# Patient Record
Sex: Male | Born: 1945 | Race: Black or African American | Hispanic: No | Marital: Single | Smoking: Former smoker
Health system: Southern US, Community
[De-identification: ages and names within clinical notes are randomized; demographics above are authoritative.]

---

## 2005-05-13 ENCOUNTER — Emergency Department (HOSPITAL_COMMUNITY): Admission: EM | Admit: 2005-05-13 | Discharge: 2005-05-13 | Payer: Self-pay | Admitting: Emergency Medicine

## 2010-10-18 ENCOUNTER — Emergency Department (HOSPITAL_BASED_OUTPATIENT_CLINIC_OR_DEPARTMENT_OTHER)
Admission: EM | Admit: 2010-10-18 | Discharge: 2010-10-18 | Payer: Self-pay | Source: Home / Self Care | Admitting: Emergency Medicine

## 2010-10-24 ENCOUNTER — Inpatient Hospital Stay (HOSPITAL_COMMUNITY)
Admission: EM | Admit: 2010-10-24 | Discharge: 2010-10-29 | Disposition: A | Discharge status: Rehabilitation Facility | Payer: Self-pay | Source: Home / Self Care | Attending: Internal Medicine | Admitting: Internal Medicine

## 2010-10-26 ENCOUNTER — Other Ambulatory Visit: Payer: Self-pay | Admitting: Internal Medicine

## 2010-10-27 LAB — GLUCOSE, CAPILLARY
Glucose-Capillary: 90 mg/dL (ref 70–99)
Glucose-Capillary: 94 mg/dL (ref 70–99)
Glucose-Capillary: 107 mg/dL — ABNORMAL HIGH (ref 70–99)

## 2010-10-28 LAB — GLUCOSE, CAPILLARY
Glucose-Capillary: 103 mg/dL — ABNORMAL HIGH (ref 70–99)
Glucose-Capillary: 97 mg/dL (ref 70–99)
Glucose-Capillary: 94 mg/dL (ref 70–99)
Glucose-Capillary: 97 mg/dL (ref 70–99)

## 2010-10-29 ENCOUNTER — Inpatient Hospital Stay (HOSPITAL_COMMUNITY)
Admission: RE | Admit: 2010-10-29 | Discharge: 2010-11-06 | Payer: Self-pay | Attending: Physical Medicine & Rehabilitation | Admitting: Physical Medicine & Rehabilitation

## 2010-10-29 ENCOUNTER — Encounter (INDEPENDENT_AMBULATORY_CARE_PROVIDER_SITE_OTHER): Payer: Self-pay | Admitting: Internal Medicine

## 2010-10-29 LAB — GLUCOSE, CAPILLARY
Glucose-Capillary: 103 mg/dL — ABNORMAL HIGH (ref 70–99)
Glucose-Capillary: 122 mg/dL — ABNORMAL HIGH (ref 70–99)
Glucose-Capillary: 81 mg/dL (ref 70–99)

## 2010-11-08 LAB — URINALYSIS, ROUTINE W REFLEX MICROSCOPIC
Bilirubin Urine: NEGATIVE
Hgb urine dipstick: NEGATIVE
Ketones, ur: NEGATIVE mg/dL
Nitrite: POSITIVE — AB
Protein, ur: NEGATIVE mg/dL
Specific Gravity, Urine: 1.017 (ref 1.005–1.030)
Urine Glucose, Fasting: NEGATIVE mg/dL
Urobilinogen, UA: 1 mg/dL (ref 0.0–1.0)
pH: 8 (ref 5.0–8.0)

## 2010-11-08 LAB — CBC
HCT: 42 % (ref 39.0–52.0)
HCT: 41.1 % (ref 39.0–52.0)
HCT: 42.7 % (ref 39.0–52.0)
Hemoglobin: 14.4 g/dL (ref 13.0–17.0)
Hemoglobin: 13.7 g/dL (ref 13.0–17.0)
Hemoglobin: 14.8 g/dL (ref 13.0–17.0)
MCH: 28.7 pg (ref 26.0–34.0)
MCH: 28 pg (ref 26.0–34.0)
MCH: 29.1 pg (ref 26.0–34.0)
MCHC: 34.3 g/dL (ref 30.0–36.0)
MCHC: 33.3 g/dL (ref 30.0–36.0)
MCHC: 34.7 g/dL (ref 30.0–36.0)
MCV: 83.8 fL (ref 78.0–100.0)
MCV: 84 fL (ref 78.0–100.0)
MCV: 83.9 fL (ref 78.0–100.0)
Platelets: 108 10*3/uL — ABNORMAL LOW (ref 150–400)
Platelets: 102 10*3/uL — ABNORMAL LOW (ref 150–400)
Platelets: 90 10*3/uL — ABNORMAL LOW (ref 150–400)
RBC: 5.01 MIL/uL (ref 4.22–5.81)
RBC: 4.89 MIL/uL (ref 4.22–5.81)
RBC: 5.09 MIL/uL (ref 4.22–5.81)
RDW: 13.5 % (ref 11.5–15.5)
RDW: 13.7 % (ref 11.5–15.5)
RDW: 13.4 % (ref 11.5–15.5)
WBC: 4.9 10*3/uL (ref 4.0–10.5)
WBC: 5.9 10*3/uL (ref 4.0–10.5)
WBC: 11.4 10*3/uL — ABNORMAL HIGH (ref 4.0–10.5)

## 2010-11-08 LAB — URINE CULTURE
Colony Count: >=100000
Culture  Setup Time: 201201111550

## 2010-11-08 LAB — DIFFERENTIAL
Basophils Absolute: 0 10*3/uL (ref 0.0–0.1)
Basophils Absolute: 0 10*3/uL (ref 0.0–0.1)
Basophils Relative: 0 % (ref 0–1)
Basophils Relative: 0 % (ref 0–1)
Eosinophils Absolute: 0.1 10*3/uL (ref 0.0–0.7)
Eosinophils Absolute: 0 10*3/uL (ref 0.0–0.7)
Eosinophils Relative: 2 % (ref 0–5)
Eosinophils Relative: 0 % (ref 0–5)
Lymphocytes Relative: 32 % (ref 12–46)
Lymphocytes Relative: 6 % — ABNORMAL LOW (ref 12–46)
Lymphs Abs: 1.9 10*3/uL (ref 0.7–4.0)
Lymphs Abs: 0.6 10*3/uL — ABNORMAL LOW (ref 0.7–4.0)
Monocytes Absolute: 0.4 10*3/uL (ref 0.1–1.0)
Monocytes Absolute: 0.8 10*3/uL (ref 0.1–1.0)
Monocytes Relative: 6 % (ref 3–12)
Monocytes Relative: 7 % (ref 3–12)
Neutro Abs: 3.5 10*3/uL (ref 1.7–7.7)
Neutro Abs: 10 10*3/uL — ABNORMAL HIGH (ref 1.7–7.7)
Neutrophils Relative %: 60 % (ref 43–77)
Neutrophils Relative %: 87 % — ABNORMAL HIGH (ref 43–77)

## 2010-11-08 LAB — COMPREHENSIVE METABOLIC PANEL
ALT: 9 U/L (ref 0–53)
AST: 13 U/L (ref 0–37)
Albumin: 3.4 g/dL — ABNORMAL LOW (ref 3.5–5.2)
Alkaline Phosphatase: 60 U/L (ref 39–117)
BUN: 11 mg/dL (ref 6–23)
CO2: 27 mEq/L (ref 19–32)
Calcium: 8.9 mg/dL (ref 8.4–10.5)
Chloride: 107 mEq/L (ref 96–112)
Creatinine, Ser: 0.97 mg/dL (ref 0.4–1.5)
GFR calc Af Amer: >60 mL/min (ref >60)
GFR calc non Af Amer: >60 mL/min (ref >60)
Glucose, Bld: 94 mg/dL (ref 70–99)
Potassium: 4.3 mEq/L (ref 3.5–5.1)
Sodium: 137 mEq/L (ref 135–145)
Total Bilirubin: 0.5 mg/dL (ref 0.3–1.2)
Total Protein: 6.6 g/dL (ref 6.0–8.3)

## 2010-11-08 LAB — URINE MICROSCOPIC-ADD ON

## 2010-11-10 LAB — CULTURE, BLOOD (ROUTINE X 2)
Culture  Setup Time: 201201111103
Culture  Setup Time: 201201111103
Culture: NO GROWTH
Culture: NO GROWTH
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
## 2010-11-12 NOTE — Discharge Summary (Signed)
NAME:  Drew Reynolds, Drew Reynolds NO.:  [id number]  MEDICAL RECORD NO.:  [id number]           PATIENT TYPE:  LOCATION:                                 FACILITY:  PHYSICIAN:  Pincus Large, MD     DATE OF BIRTH:  1946/01/11  DATE OF ADMISSION:  10/24/2010 DATE OF DISCHARGE:  10/29/2010                              DISCHARGE SUMMARY   PRIMARY CARE PHYSICIAN:  Unassigned.  Time spent on discharge summary was 35 minutes.  REASON FOR ADMISSION:  Unable to walk.  DISCHARGE DIAGNOSES: 1. Acute/subacute moderate size infarct, inferior left cerebellum and     the left aspect of the inferior vermis. 2. Remote infarct in the right cerebellum with encephalomalacia. 3. Cervicomedullary junction, unremarkable. 4. Global brain atrophy. 5. Hypertension. 6. Tobacco abuse.  DISCHARGE MEDICATIONS: 1. Aspirin 325 mg daily. 2. Tylenol 650 mg 6 p.r.n. 3. Metoprolol 12.5 p.o. b.i.d. 4. Norvasc 10 mg daily. 5. Simvastatin 10 daily.  RADIOLOGY DATA:  CT of the head was done, which shows abnormal low- attenuation in the inferior cerebellum hemisphere bilaterally left greater than right.  Imaging feature are compatible with acute to subacute ischemia.  There is edema in the left inferior cerebellar hemisphere, it does generate some mass effect on the fourth ventricle. Abdominal x-ray was done, which shows single polyp segment overlying the region of the left lateral sacrum.  MRI of the brain shows acute/subacute puncture site infarct in the inferior left cerebellum and the left aspect of the anterior vermis.  A remote infarct and small vessel disease type change are noted on global atrophy.  MRA of the neck was done, which had significant intracranial atherosclerotic type changes.  HOSPITAL COURSE:  A 65 year old gentleman who reported for the last 2 weeks, he had difficulty with gait.  He also had some dizziness as well, his balance has been so poor, his sister finally convinced  him to come to the doctor and on initial evaluation, the CT of the head shows bilateral cerebellar infarct.  The patient was admitted to the stroke floor.  A consult done with Neurology and recommendation was to do an MRI and MRA of the brain, echocardiogram, and start aspirin for now. The patient also was advised to stop smoking.  PT and OT consult was done and the patient qualify for a CIR.  The patient is going to be transferred to CIR today to continue with his blood pressure medication. Blood pressure medication was started; metoprolol 12.5 p.o. and then Norvasc was added because we had difficulty controlling his blood pressure.  Also, smoking cessation was done at the bedside.  PHYSICAL EXAMINATION:  VITAL SIGNS:  On discharge, temperature is 98.5, pulse is 76, respirations 18, and blood pressure is 150/99. GENERAL:  He is awake and oriented x3.  Lying in bed. HEENT:  Normocephalic, atraumatic.  Pupils are equal and reactive to light bilaterally. LUNGS:  No wheezes or crackles. ABDOMEN:  Soft.  Positive bowel sounds present. EXTREMITIES:  No cyanosis.  No edema.  DISPOSITION:  CIR.  DIET:  2-g sodium.          ______________________________ Granville Lewis  Boris Sharper, MD     SA/MEDQ  D:  10/29/2010  T:  10/29/2010  Job:  [id number]  Electronically Signed by Pincus Large MD on 11/12/2010 11:48:28 AM

## 2010-12-17 ENCOUNTER — Encounter: Payer: Medicaid Other | Attending: Physical Medicine & Rehabilitation

## 2010-12-17 ENCOUNTER — Inpatient Hospital Stay: Payer: Self-pay | Admitting: Physical Medicine & Rehabilitation

## 2011-01-03 LAB — CBC
HCT: 46.2 % (ref 39.0–52.0)
Hemoglobin: 16.5 g/dL (ref 13.0–17.0)
Hemoglobin: 14.3 g/dL (ref 13.0–17.0)
MCH: 27.9 pg (ref 26.0–34.0)
MCH: 28.7 pg (ref 26.0–34.0)
MCHC: 35.7 g/dL (ref 30.0–36.0)
MCV: 78 fL (ref 78.0–100.0)
MCV: 83.3 fL (ref 78.0–100.0)
Platelets: 116 10*3/uL — ABNORMAL LOW (ref 150–400)
RBC: 5.92 MIL/uL — ABNORMAL HIGH (ref 4.22–5.81)
RBC: 4.98 MIL/uL (ref 4.22–5.81)
RDW: 13.2 % (ref 11.5–15.5)
WBC: 5.3 10*3/uL (ref 4.0–10.5)

## 2011-01-03 LAB — URINE CULTURE
Colony Count: NO GROWTH
Culture  Setup Time: 201112261900
Culture: NO GROWTH

## 2011-01-03 LAB — LIPID PANEL
HDL: 36 mg/dL — ABNORMAL LOW (ref >39)
Total CHOL/HDL Ratio: 3.5 RATIO
VLDL: 10 mg/dL (ref 0–40)

## 2011-01-03 LAB — POCT CARDIAC MARKERS
CKMB, poc: <1 ng/mL — ABNORMAL LOW (ref 1.0–8.0)
CKMB, poc: <1 ng/mL — ABNORMAL LOW (ref 1.0–8.0)
Myoglobin, poc: 127 ng/mL (ref 12–200)
Myoglobin, poc: 87.4 ng/mL (ref 12–200)
Troponin i, poc: <0.05 ng/mL (ref 0.00–0.09)
Troponin i, poc: <0.05 ng/mL (ref 0.00–0.09)

## 2011-01-03 LAB — LIPASE, BLOOD: Lipase: 85 U/L (ref 23–300)

## 2011-01-03 LAB — CULTURE, BLOOD (ROUTINE X 2)
Culture  Setup Time: 201201020124
Culture  Setup Time: 201201020125
Culture: NO GROWTH
Culture: NO GROWTH

## 2011-01-03 LAB — DIFFERENTIAL
Basophils Absolute: 0 10*3/uL (ref 0.0–0.1)
Basophils Relative: 0 % (ref 0–1)
Eosinophils Absolute: 0 10*3/uL (ref 0.0–0.7)
Eosinophils Absolute: 0 10*3/uL (ref 0.0–0.7)
Eosinophils Relative: 0 % (ref 0–5)
Eosinophils Relative: 1 % (ref 0–5)
Lymphocytes Relative: 15 % (ref 12–46)
Lymphs Abs: 0.8 10*3/uL (ref 0.7–4.0)
Lymphs Abs: 1.5 10*3/uL (ref 0.7–4.0)
Monocytes Absolute: 0.3 10*3/uL (ref 0.1–1.0)
Monocytes Relative: 6 % (ref 3–12)
Monocytes Relative: 7 % (ref 3–12)
Neutro Abs: 4.2 10*3/uL (ref 1.7–7.7)
Neutrophils Relative %: 79 % — ABNORMAL HIGH (ref 43–77)

## 2011-01-03 LAB — CARDIAC PANEL(CRET KIN+CKTOT+MB+TROPI)
CK, MB: 0.8 ng/mL (ref 0.3–4.0)
Relative Index: INVALID (ref 0.0–2.5)
Total CK: 40 U/L (ref 7–232)
Total CK: 43 U/L (ref 7–232)
Troponin I: 0.02 ng/mL (ref 0.00–0.06)

## 2011-01-03 LAB — URINALYSIS, ROUTINE W REFLEX MICROSCOPIC
Glucose, UA: NEGATIVE mg/dL
Glucose, UA: NEGATIVE mg/dL
Hgb urine dipstick: NEGATIVE
Hgb urine dipstick: NEGATIVE
Ketones, ur: 15 mg/dL — AB
Ketones, ur: NEGATIVE mg/dL
Nitrite: NEGATIVE
Protein, ur: NEGATIVE mg/dL
Protein, ur: NEGATIVE mg/dL
Specific Gravity, Urine: 1.022 (ref 1.005–1.030)
Urobilinogen, UA: 1 mg/dL (ref 0.0–1.0)
pH: 5.5 (ref 5.0–8.0)
pH: 7 (ref 5.0–8.0)

## 2011-01-03 LAB — COMPREHENSIVE METABOLIC PANEL
ALT: 13 U/L (ref 0–53)
ALT: 8 U/L (ref 0–53)
AST: 22 U/L (ref 0–37)
Albumin: 4.7 g/dL (ref 3.5–5.2)
Alkaline Phosphatase: 119 U/L — ABNORMAL HIGH (ref 39–117)
BUN: 15 mg/dL (ref 6–23)
CO2: 27 mEq/L (ref 19–32)
Calcium: 9.8 mg/dL (ref 8.4–10.5)
Calcium: 9 mg/dL (ref 8.4–10.5)
Chloride: 97 mEq/L (ref 96–112)
Creatinine, Ser: 1 mg/dL (ref 0.4–1.5)
Creatinine, Ser: 1.01 mg/dL (ref 0.4–1.5)
GFR calc Af Amer: >60 mL/min (ref >60)
GFR calc non Af Amer: >60 mL/min (ref >60)
GFR calc non Af Amer: >60 mL/min (ref >60)
Glucose, Bld: 128 mg/dL — ABNORMAL HIGH (ref 70–99)
Glucose, Bld: 99 mg/dL (ref 70–99)
Potassium: 4 mEq/L (ref 3.5–5.1)
Sodium: 140 mEq/L (ref 135–145)
Sodium: 137 mEq/L (ref 135–145)
Total Bilirubin: 1.1 mg/dL (ref 0.3–1.2)
Total Protein: 9.2 g/dL — ABNORMAL HIGH (ref 6.0–8.3)
Total Protein: 6.7 g/dL (ref 6.0–8.3)

## 2011-01-03 LAB — GLUCOSE, CAPILLARY
Glucose-Capillary: 101 mg/dL — ABNORMAL HIGH (ref 70–99)
Glucose-Capillary: 126 mg/dL — ABNORMAL HIGH (ref 70–99)
Glucose-Capillary: 86 mg/dL (ref 70–99)
Glucose-Capillary: 90 mg/dL (ref 70–99)
Glucose-Capillary: 93 mg/dL (ref 70–99)
Glucose-Capillary: 94 mg/dL (ref 70–99)

## 2011-01-03 LAB — BASIC METABOLIC PANEL
BUN: 10 mg/dL (ref 6–23)
CO2: 25 mEq/L (ref 19–32)
Calcium: 8.9 mg/dL (ref 8.4–10.5)
Chloride: 106 mEq/L (ref 96–112)
Creatinine, Ser: 0.95 mg/dL (ref 0.4–1.5)
GFR calc Af Amer: >60 mL/min (ref >60)
GFR calc non Af Amer: >60 mL/min (ref >60)
Glucose, Bld: 89 mg/dL (ref 70–99)
Potassium: 4.2 mEq/L (ref 3.5–5.1)
Sodium: 137 mEq/L (ref 135–145)

## 2011-01-03 LAB — TSH: TSH: 1.583 u[IU]/mL (ref 0.350–4.500)

## 2011-01-03 LAB — HEMOGLOBIN A1C
Hgb A1c MFr Bld: 5.5 % (ref <5.7)
Mean Plasma Glucose: 111 mg/dL (ref <117)

## 2011-01-03 LAB — ETHANOL: Alcohol, Ethyl (B): <10 mg/dL (ref 0–10)

## 2011-01-03 LAB — PROTIME-INR: Prothrombin Time: 15.3 seconds — ABNORMAL HIGH (ref 11.6–15.2)

## 2011-01-03 LAB — RAPID URINE DRUG SCREEN, HOSP PERFORMED
Amphetamines: NOT DETECTED
Barbiturates: NOT DETECTED
Benzodiazepines: NOT DETECTED
Cocaine: NOT DETECTED

## 2011-01-03 LAB — PHOSPHORUS: Phosphorus: 3.8 mg/dL (ref 2.3–4.6)

## 2011-01-03 LAB — MAGNESIUM: Magnesium: 2.1 mg/dL (ref 1.5–2.5)

## 2017-11-21 DIAGNOSIS — E785 Hyperlipidemia, unspecified: Secondary | ICD-10-CM | POA: Insufficient documentation

## 2017-11-21 DIAGNOSIS — R32 Unspecified urinary incontinence: Secondary | ICD-10-CM | POA: Insufficient documentation

## 2017-11-21 DIAGNOSIS — R222 Localized swelling, mass and lump, trunk: Secondary | ICD-10-CM | POA: Insufficient documentation

## 2017-11-21 DIAGNOSIS — I10 Essential (primary) hypertension: Secondary | ICD-10-CM | POA: Insufficient documentation

## 2017-11-21 DIAGNOSIS — R262 Difficulty in walking, not elsewhere classified: Secondary | ICD-10-CM | POA: Insufficient documentation

## 2017-11-22 ENCOUNTER — Other Ambulatory Visit: Payer: Self-pay | Admitting: Physician Assistant

## 2017-11-22 DIAGNOSIS — R222 Localized swelling, mass and lump, trunk: Secondary | ICD-10-CM

## 2017-11-23 ENCOUNTER — Other Ambulatory Visit: Payer: Self-pay

## 2017-11-29 ENCOUNTER — Ambulatory Visit
Admission: RE | Admit: 2017-11-29 | Discharge: 2017-11-29 | Disposition: A | Discharge status: Home / Self Care | Payer: Medicare Other | Source: Ambulatory Visit | Attending: Physician Assistant | Admitting: Physician Assistant

## 2017-11-29 DIAGNOSIS — R222 Localized swelling, mass and lump, trunk: Secondary | ICD-10-CM

## 2018-01-15 ENCOUNTER — Ambulatory Visit: Payer: Self-pay | Admitting: Surgery
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
## 2018-03-28 DIAGNOSIS — E876 Hypokalemia: Secondary | ICD-10-CM | POA: Insufficient documentation

## 2018-04-11 ENCOUNTER — Encounter: Payer: Self-pay | Admitting: Podiatry

## 2018-04-11 ENCOUNTER — Ambulatory Visit (INDEPENDENT_AMBULATORY_CARE_PROVIDER_SITE_OTHER): Payer: 59 | Admitting: Podiatry

## 2018-04-11 VITALS — BP 140/80 | HR 56

## 2018-04-11 DIAGNOSIS — M79674 Pain in right toe(s): Secondary | ICD-10-CM

## 2018-04-11 DIAGNOSIS — B351 Tinea unguium: Secondary | ICD-10-CM | POA: Diagnosis not present

## 2018-04-11 DIAGNOSIS — L84 Corns and callosities: Secondary | ICD-10-CM | POA: Diagnosis not present

## 2018-04-11 DIAGNOSIS — G629 Polyneuropathy, unspecified: Secondary | ICD-10-CM | POA: Diagnosis not present

## 2018-04-11 DIAGNOSIS — M79675 Pain in left toe(s): Secondary | ICD-10-CM

## 2018-04-12 NOTE — Progress Notes (Signed)
Complaint:  Visit Type: Patient returns to my office for continued preventative foot care services. Complaint: Patient states" my nails have grown long and thick and become painful to walk and wear shoes" Patient also has painful callus on both big toes. The patient presents for preventative foot care services. No changes to ROS  Podiatric Exam: Vascular: dorsalis pedis  are not palpable bilateral. Posterior tibial pulses are weakly palpable. Capillary return is immediate. Temperature gradient is WNL. Skin turgor WNL  Sensorium: Absent  Semmes Weinstein monofilament test. Normal tactile sensation bilaterally. Nail Exam: Pt has thick disfigured discolored nails with subungual debris noted bilateral entire nail hallux through fifth toenails Ulcer Exam: There is no evidence of ulcer or pre-ulcerative changes or infection. Orthopedic Exam: Muscle tone and strength are WNL. No limitations in general ROM. No crepitus or effusions noted. Foot type and digits show no abnormalities. HAV  B/L.  Hammer toes 2-5  B/L. Skin: No Porokeratosis. No infection or ulcers.  Pinch callus hallux  B/L with right greater than left.    Diagnosis:  Onychomycosis, , Pain in right toe, pain in left toes  Treatment & Plan Procedures and Treatment: Consent by patient was obtained for treatment procedures.   Debridement of mycotic and hypertrophic toenails, 1 through 5 bilateral and clearing of subungual debris. No ulceration, no infection noted. ABN signed for 2019. Return Visit-Office Procedure: Patient instructed to return to the office for a follow up visit 3 months for continued evaluation and treatment.    Helane GuntherGregory Chyrl Elwell DPM

## 2018-07-09 ENCOUNTER — Telehealth: Payer: Self-pay | Admitting: Podiatry

## 2018-07-09 NOTE — Telephone Encounter (Signed)
This is Drew Reynolds, and I'm calling in regards to Drew Reynolds. I'm here with his son who is his power of attorney. We are calling to request a copy of his medical records. He had one visit with you all. If you could call Drew Reynolds at the number you have on file, [phone]. Thank you.

## 2018-07-11 ENCOUNTER — Ambulatory Visit: Payer: 59 | Admitting: Podiatry

## 2018-08-14 IMAGING — US US SOFT TISSUE EXCLUDE HEAD/NECK
1 series · 6 of 6 positions shown · non-contrast
Comparison: None.

CLINICAL DATA: Palpable mass on the left lower back

EXAM:
ULTRASOUND OF THE SOFT TISSUES OF THE CHEST WALL
TECHNIQUE: Ultrasound examination of the head and neck soft tissues was
performed in the area of clinical concern.

[Series 1: us soft tissue exclude head/neck · 0.16mm/px · 6 of 6 slices shown]
[im 1/6]
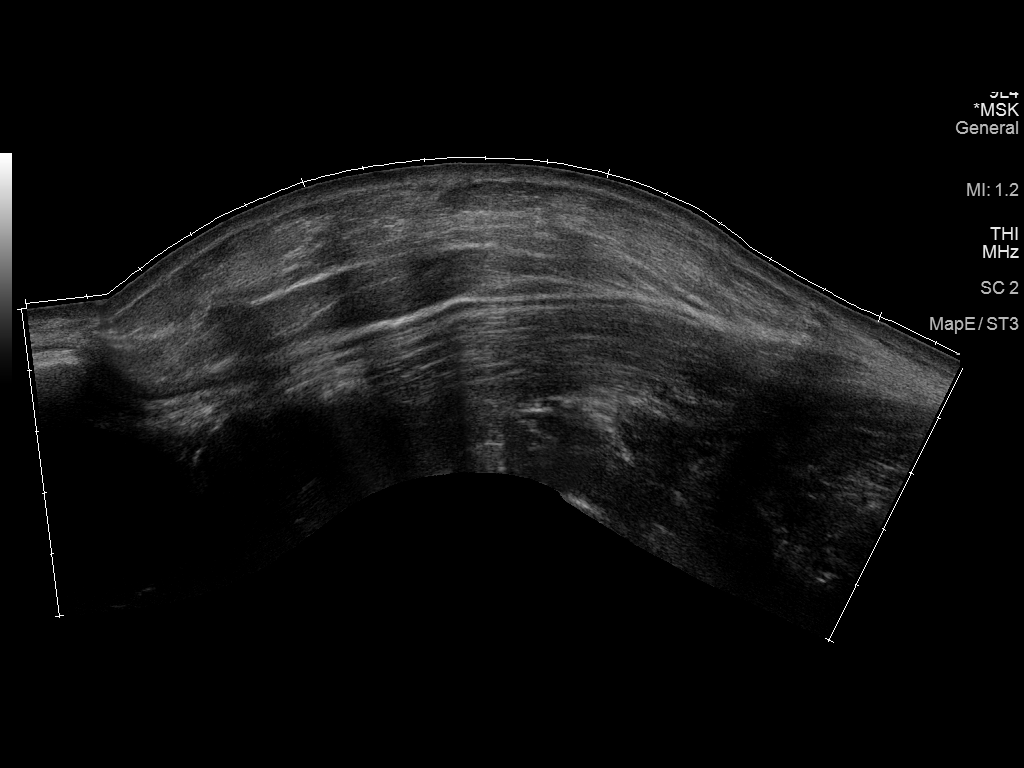
[im 2/6]
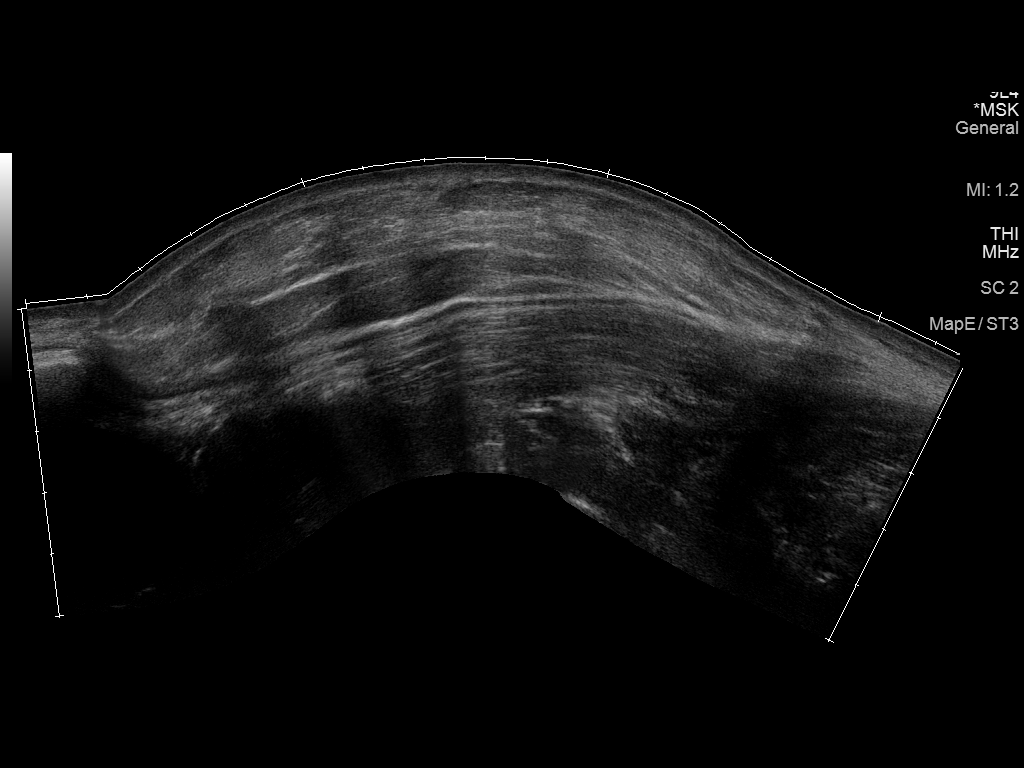
[im 3/6]
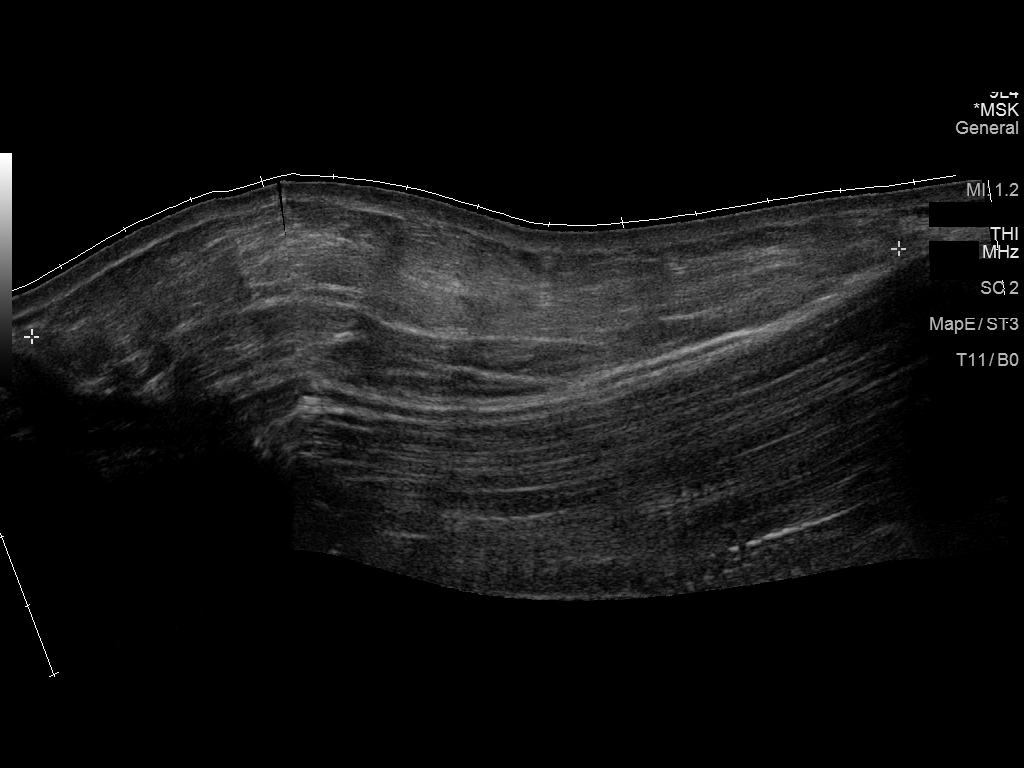
[im 4/6]
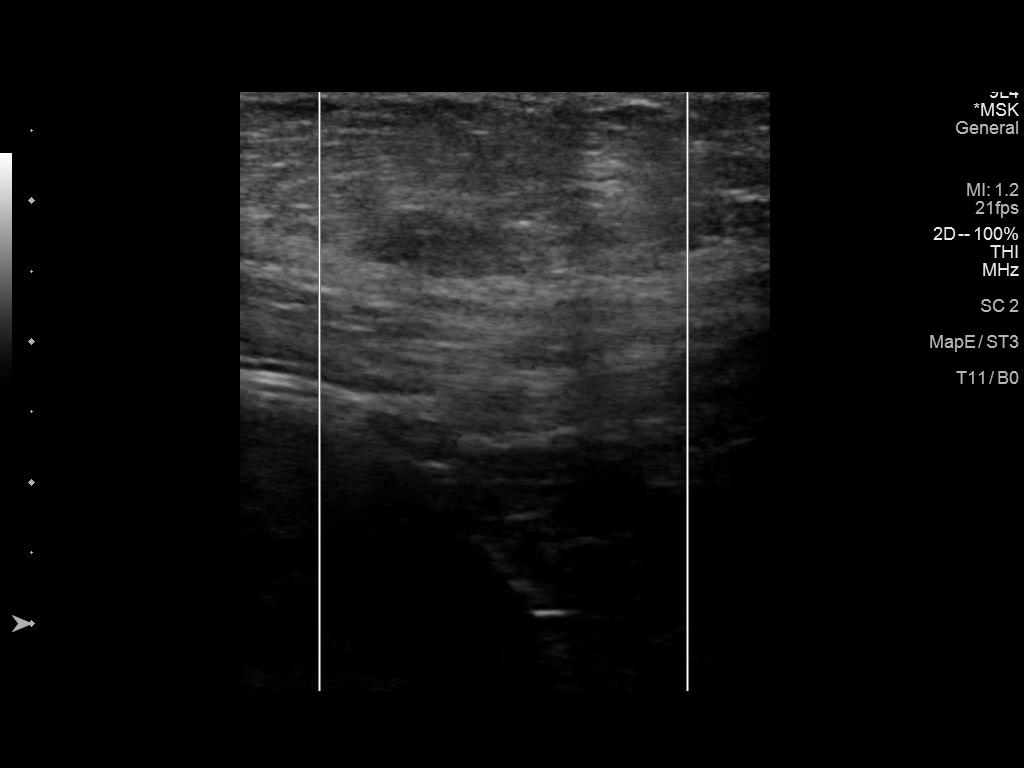
[im 5/6]
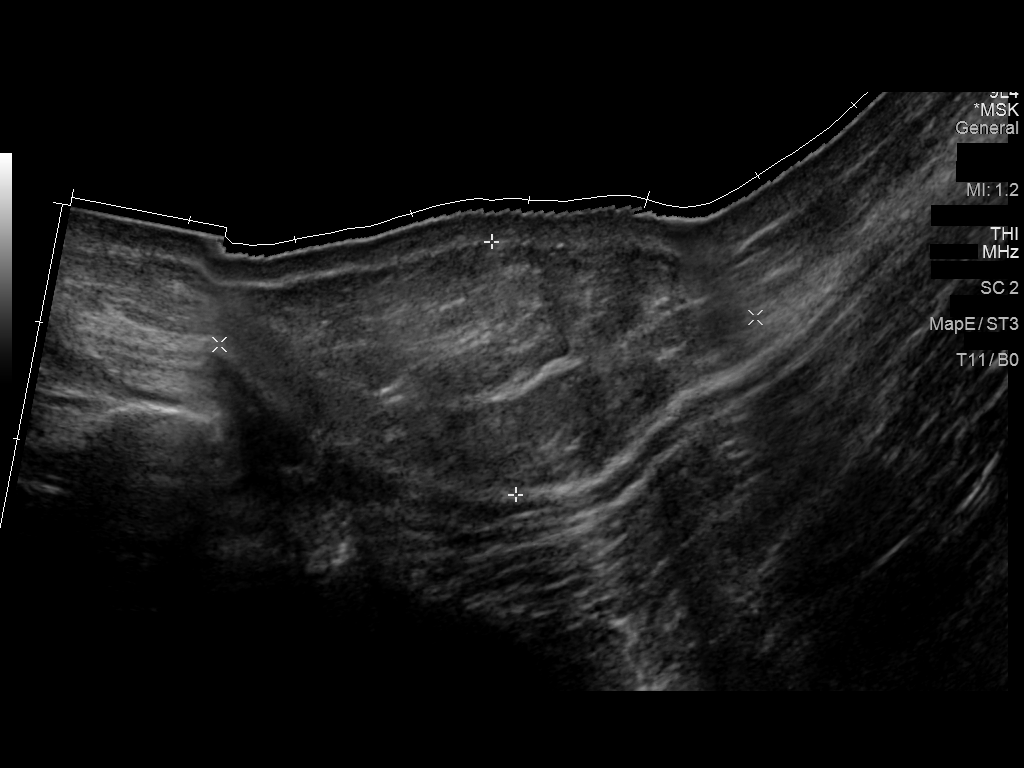
[im 6/6]
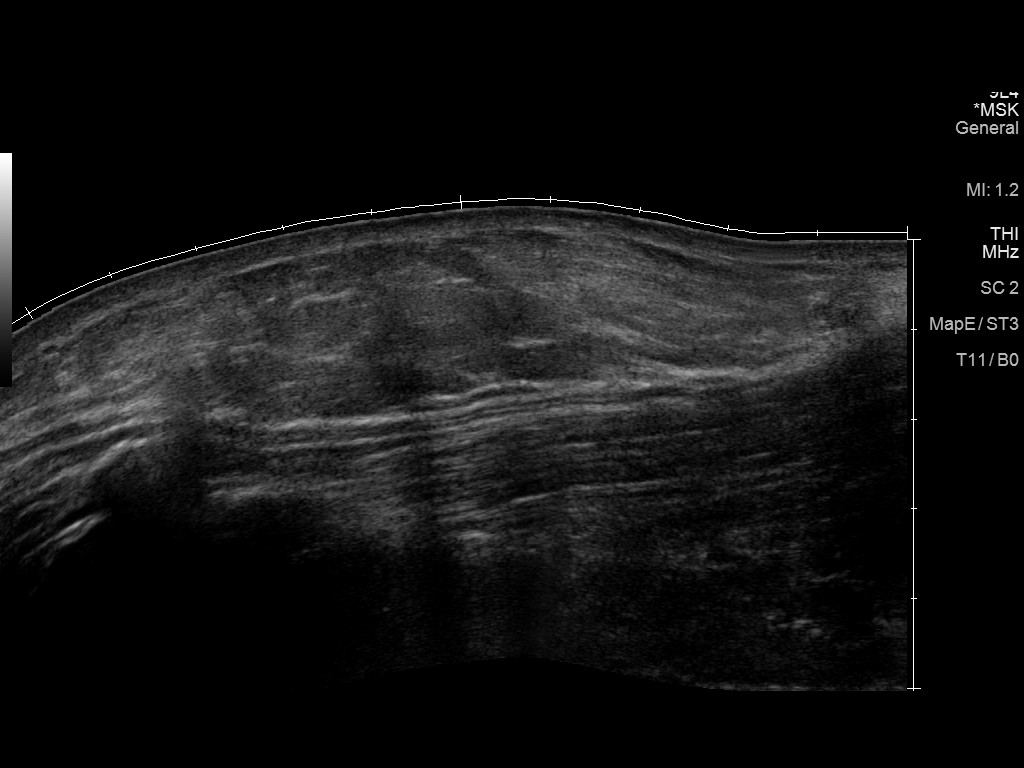

[6 of 6 positions shown; findings below may reference images not displayed]

FINDINGS: There is a mildly heterogeneous soft tissue mass lesion identified
within the subcutaneous tissues of the left lower back. It lies
immediately below the skin surface but above the muscular layer and
is most consistent with a lipoma.
IMPRESSION: Changes most consistent with a subcutaneous lipoma. Clinical
follow-up is recommended. If the mass enlarges MRI may be helpful
for further clarification.

## 2019-05-25 DEATH — deceased
